# Patient Record
Sex: Male | Born: 1985 | Race: White | Hispanic: No | Marital: Single | Smoking: Current every day smoker
Health system: Southern US, Community
[De-identification: ages and names within clinical notes are randomized; demographics above are authoritative.]

## PROBLEM LIST (undated history)

## (undated) ENCOUNTER — Emergency Department (HOSPITAL_COMMUNITY): Discharge status: Against Medical Advice | Payer: Self-pay

## (undated) DIAGNOSIS — F909 Attention-deficit hyperactivity disorder, unspecified type: Secondary | ICD-10-CM

## (undated) DIAGNOSIS — R002 Palpitations: Secondary | ICD-10-CM

---
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
## 2004-08-01 ENCOUNTER — Emergency Department (HOSPITAL_COMMUNITY): Admission: EM | Admit: 2004-08-01 | Discharge: 2004-08-02 | Payer: Self-pay | Admitting: Emergency Medicine
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
## 2010-09-24 ENCOUNTER — Emergency Department (HOSPITAL_COMMUNITY)
Admission: EM | Admit: 2010-09-24 | Discharge: 2010-09-25 | Disposition: A | Discharge status: Home / Self Care | Payer: Self-pay | Attending: Emergency Medicine | Admitting: Emergency Medicine

## 2010-09-24 DIAGNOSIS — W540XXA Bitten by dog, initial encounter: Secondary | ICD-10-CM | POA: Insufficient documentation

## 2010-09-24 DIAGNOSIS — T148XXA Other injury of unspecified body region, initial encounter: Secondary | ICD-10-CM | POA: Insufficient documentation

## 2010-09-24 DIAGNOSIS — Z87891 Personal history of nicotine dependence: Secondary | ICD-10-CM | POA: Insufficient documentation

## 2012-02-16 ENCOUNTER — Emergency Department (HOSPITAL_COMMUNITY)
Admission: EM | Admit: 2012-02-16 | Discharge: 2012-02-17 | Disposition: A | Discharge status: Home / Self Care | Payer: Self-pay | Attending: Emergency Medicine | Admitting: Emergency Medicine

## 2012-02-16 ENCOUNTER — Encounter (HOSPITAL_COMMUNITY): Payer: Self-pay | Admitting: Emergency Medicine

## 2012-02-16 DIAGNOSIS — Z8659 Personal history of other mental and behavioral disorders: Secondary | ICD-10-CM | POA: Insufficient documentation

## 2012-02-16 DIAGNOSIS — F1010 Alcohol abuse, uncomplicated: Secondary | ICD-10-CM | POA: Insufficient documentation

## 2012-02-16 DIAGNOSIS — F17200 Nicotine dependence, unspecified, uncomplicated: Secondary | ICD-10-CM | POA: Insufficient documentation

## 2012-02-16 DIAGNOSIS — Z8679 Personal history of other diseases of the circulatory system: Secondary | ICD-10-CM | POA: Insufficient documentation

## 2012-02-16 DIAGNOSIS — F1210 Cannabis abuse, uncomplicated: Secondary | ICD-10-CM | POA: Insufficient documentation

## 2012-02-16 DIAGNOSIS — F4320 Adjustment disorder, unspecified: Secondary | ICD-10-CM | POA: Insufficient documentation

## 2012-02-16 HISTORY — DX: Palpitations: R00.2

## 2012-02-16 HISTORY — DX: Attention-deficit hyperactivity disorder, unspecified type: F90.9

## 2012-02-16 LAB — COMPREHENSIVE METABOLIC PANEL
Alkaline Phosphatase: 50 U/L (ref 39–117)
BUN: 20 mg/dL (ref 6–23)
GFR calc Af Amer: >90 mL/min (ref >90)
Glucose, Bld: 100 mg/dL — ABNORMAL HIGH (ref 70–99)
Potassium: 3.8 mEq/L (ref 3.5–5.1)
Total Bilirubin: 0.4 mg/dL (ref 0.3–1.2)
Total Protein: 8.1 g/dL (ref 6.0–8.3)

## 2012-02-16 LAB — ACETAMINOPHEN LEVEL: Acetaminophen (Tylenol), Serum: <15 ug/mL (ref 10–30)

## 2012-02-16 LAB — ETHANOL: Alcohol, Ethyl (B): <11 mg/dL (ref 0–11)

## 2012-02-16 LAB — SALICYLATE LEVEL: Salicylate Lvl: 3.4 mg/dL (ref 2.8–20.0)

## 2012-02-16 LAB — CBC
HCT: 44.6 % (ref 39.0–52.0)
Hemoglobin: 15.9 g/dL (ref 13.0–17.0)
MCH: 30.5 pg (ref 26.0–34.0)
MCHC: 35.7 g/dL (ref 30.0–36.0)
MCV: 85.6 fL (ref 78.0–100.0)
Platelets: 237 10*3/uL (ref 150–400)
RBC: 5.21 MIL/uL (ref 4.22–5.81)
RDW: 11.9 % (ref 11.5–15.5)
WBC: 13.2 10*3/uL — ABNORMAL HIGH (ref 4.0–10.5)

## 2012-02-16 LAB — RAPID URINE DRUG SCREEN, HOSP PERFORMED
Barbiturates: NOT DETECTED
Cocaine: NOT DETECTED

## 2012-02-16 MED ORDER — IBUPROFEN 200 MG PO TABS: 600.0000 mg | ORAL_TABLET | Freq: Three times a day (TID) | ORAL | Status: DC | PRN

## 2012-02-16 MED ORDER — NICOTINE 21 MG/24HR TD PT24: 21.0000 mg | MEDICATED_PATCH | Freq: Every day | TRANSDERMAL | Status: DC

## 2012-02-16 MED ORDER — LORAZEPAM 1 MG PO TABS: 1.0000 mg | ORAL_TABLET | Freq: Three times a day (TID) | ORAL | Status: DC | PRN

## 2012-02-16 MED ORDER — ZOLPIDEM TARTRATE 5 MG PO TABS: 5.0000 mg | ORAL_TABLET | Freq: Every evening | ORAL | Status: DC | PRN

## 2012-02-16 MED ORDER — ONDANSETRON HCL 4 MG PO TABS: 4.0000 mg | ORAL_TABLET | Freq: Three times a day (TID) | ORAL | Status: DC | PRN

## 2012-02-16 MED ORDER — ALUM & MAG HYDROXIDE-SIMETH 200-200-20 MG/5ML PO SUSP: 30.0000 mL | ORAL | Status: DC | PRN

## 2012-02-16 NOTE — ED Notes (Addendum)
Pt denies SI/HI. Mother has called the police to bring him here b/c of him being "emotionally out of control" and his anger issues.  Pt is voluntary and states he wants to talk to a doctor to help him get his mind straight.  Deputy [place] ([place]) is willing to come back to pick him and take him home if necessary.
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
## 2012-02-16 NOTE — ED Provider Notes (Signed)
Medical screening examination/treatment/procedure(s) were performed by non-physician practitioner and as supervising physician I was immediately available for consultation/collaboration.  Celene Kras, MD 02/16/12 2322

## 2012-02-16 NOTE — [facility] Counselor (Signed)
Telepsych paperwork completed and called in to [facility].
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
## 2012-02-16 NOTE — ED Notes (Signed)
Called up front to see if they have IVC papers on pt.

## 2012-02-18 NOTE — ED Provider Notes (Signed)
Medical screening examination/treatment/procedure(s) were performed by non-physician practitioner and as supervising physician I was immediately available for consultation/collaboration.   Nikos Anglemyer R Renny Remer, MD 02/18/12 2148 

## 2014-01-05 ENCOUNTER — Emergency Department (HOSPITAL_COMMUNITY)
Admission: EM | Admit: 2014-01-05 | Discharge: 2014-01-06 | Disposition: A | Discharge status: Home / Self Care | Payer: Self-pay | Attending: Emergency Medicine | Admitting: Emergency Medicine

## 2014-01-05 ENCOUNTER — Encounter (HOSPITAL_COMMUNITY): Payer: Self-pay | Admitting: Emergency Medicine

## 2014-01-05 DIAGNOSIS — F919 Conduct disorder, unspecified: Secondary | ICD-10-CM | POA: Insufficient documentation

## 2014-01-05 DIAGNOSIS — F1210 Cannabis abuse, uncomplicated: Secondary | ICD-10-CM | POA: Insufficient documentation

## 2014-01-05 DIAGNOSIS — R4689 Other symptoms and signs involving appearance and behavior: Secondary | ICD-10-CM

## 2014-01-05 DIAGNOSIS — Z720 Tobacco use: Secondary | ICD-10-CM | POA: Insufficient documentation

## 2014-01-05 LAB — COMPREHENSIVE METABOLIC PANEL
ALBUMIN: 4.4 g/dL (ref 3.5–5.2)
ALT: 25 U/L (ref 0–53)
ANION GAP: 5 (ref 5–15)
AST: 28 U/L (ref 0–37)
Alkaline Phosphatase: 51 U/L (ref 39–117)
BILIRUBIN TOTAL: 0.7 mg/dL (ref 0.3–1.2)
BUN: 15 mg/dL (ref 6–23)
CALCIUM: 9 mg/dL (ref 8.4–10.5)
CHLORIDE: 108 meq/L (ref 96–112)
CO2: 26 mmol/L (ref 19–32)
CREATININE: 0.87 mg/dL (ref 0.50–1.35)
GFR calc Af Amer: >90 mL/min (ref >90)
GFR calc non Af Amer: >90 mL/min (ref >90)
Glucose, Bld: 121 mg/dL — ABNORMAL HIGH (ref 70–99)
Potassium: 3.6 mmol/L (ref 3.5–5.1)
Sodium: 139 mmol/L (ref 135–145)
TOTAL PROTEIN: 7.6 g/dL (ref 6.0–8.3)

## 2014-01-05 LAB — RAPID URINE DRUG SCREEN, HOSP PERFORMED
Amphetamines: NOT DETECTED
BARBITURATES: NOT DETECTED
BENZODIAZEPINES: NOT DETECTED
COCAINE: NOT DETECTED
Opiates: NOT DETECTED
Tetrahydrocannabinol: POSITIVE — AB

## 2014-01-05 LAB — CBC
HEMATOCRIT: 43.2 % (ref 39.0–52.0)
Hemoglobin: 14.6 g/dL (ref 13.0–17.0)
MCH: 30.5 pg (ref 26.0–34.0)
MCHC: 33.8 g/dL (ref 30.0–36.0)
MCV: 90.2 fL (ref 78.0–100.0)
PLATELETS: 243 10*3/uL (ref 150–400)
RBC: 4.79 MIL/uL (ref 4.22–5.81)
RDW: 12.1 % (ref 11.5–15.5)
WBC: 12 10*3/uL — AB (ref 4.0–10.5)

## 2014-01-05 LAB — SALICYLATE LEVEL

## 2014-01-05 LAB — ETHANOL

## 2014-01-05 LAB — ACETAMINOPHEN LEVEL: Acetaminophen (Tylenol), Serum: <10 ug/mL — ABNORMAL LOW (ref 10–30)
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
## 2014-01-05 NOTE — ED Notes (Signed)
Pt arrived to the ED with a complaint of aggressive behavior.  PT is IVC'd by his father.  IVC paperwork states that Pt has been drinking and doing drugs, becoming aggressive with parents and neighbors.  Pt states complaints against him are overrated.  Pt states that his does use xanax occasionally since the death of his brother in April.  Pt is calm and cooperative in triage.  Sheriff is present in the room

## 2014-01-05 NOTE — ED Notes (Signed)
TTS at bedside. 
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
## 2014-01-06 NOTE — ED Notes (Signed)
Sheriff called for pt transport.

## 2014-01-06 NOTE — Discharge Instructions (Signed)
Aggression Physically aggressive behavior is common among small children. When frustrated or angry, toddlers may act out. Often, they will push, bite, or hit. Most children show less physical aggression as they grow up. Their language and interpersonal skills improve, too. But continued aggressive behavior is a sign of a problem. This behavior can lead to aggression and delinquency in adolescence and adulthood. Aggressive behavior can be psychological or physical. Forms of psychological aggression include threatening or bullying others. Forms of physical aggression include:  Pushing.  Hitting.  Slapping.  Kicking.  Stabbing.  Shooting.  Raping. PREVENTION  Encouraging the following behaviors can help manage aggression:  Respecting others and valuing differences.  Participating in school and community functions, including sports, music, after-school programs, community groups, and volunteer work.  Talking with an adult when they are sad, depressed, fearful, anxious, or angry. Discussions with a parent or other family member, Veterinary surgeoncounselor, Runner, broadcasting/film/videoteacher, or coach can help.  Avoiding alcohol and drug use.  Dealing with disagreements without aggression, such as conflict resolution. To learn this, children need parents and caregivers to model respectful communication and problem solving.  Limiting exposure to aggression and violence, such as video games that are not age appropriate, violence in the media, or domestic violence. Document Released: 10/23/2006 Document Revised: 03/20/2011 Document Reviewed: 03/03/2010 [facility] Patient Information 2015 [place], [place]. This information is not intended to replace advice given to you by your health care provider. Make sure you discuss any questions you have with your health care provider.   Emergency Department Resource Guide 1) Find a Doctor and Pay Out of Pocket Although you won't have to find out who is covered by your insurance plan, it is a  good idea to ask around and get recommendations. You will then need to call the office and see if the doctor you have chosen will accept you as a new patient and what types of options they offer for patients who are self-pay. Some doctors offer discounts or will set up payment plans for their patients who do not have insurance, but you will need to ask so you aren't surprised when you get to your appointment.  2) Contact Your Local Health Department Not all health departments have doctors that can see patients for sick visits, but many do, so it is worth a call to see if yours does. If you don't know where your local health department is, you can check in your phone book. The CDC also has a tool to help you locate your state's health department, and many state websites also have listings of all of their local health departments.  3) Find a Walk-in Clinic If your illness is not likely to be very severe or complicated, you may want to try a walk in clinic. These are popping up all over the country in pharmacies, drugstores, and shopping centers. They're usually staffed by nurse practitioners or physician assistants that have been trained to treat common illnesses and complaints. They're usually fairly quick and inexpensive. However, if you have serious medical issues or chronic medical problems, these are probably not your best option.  No Primary Care Doctor: - Call Health Connect at  [phone] - they can help you locate a primary care doctor that  accepts your insurance, provides certain services, etc. - Physician Referral Service- [phone]  Chronic Pain Problems: Organization         Address  Phone   Notes  [facility]  [phone]) [phone] Patients need to be referred by  their primary care doctor.   Medication Assistance: Organization         Address  Phone   Notes  [facility] Medication [facility] [address] [place], [place] [address] [phone])  [phone] --Must be a resident of [facility] -- Must have NO insurance coverage whatsoever (no Medicaid/ Medicare, etc.) -- The pt. MUST have a primary care doctor that directs their care regularly and follows them in the community   MedAssist  [phone]   [employer]  [phone]) [phone]    Agencies that provide inexpensive medical care: Organization         Address  Phone   Notes  [facility]  [phone](336) [phone]   [facility]    [phone](336) [phone]   [facility] [address] [place], [place] [address] [phone]) [phone]   [facility] [address], [place] [phone]) [phone]   Planned Parenthood    [phone]) [phone]   [facility]    [phone]) [phone]   [facility] and [facility]  [address], [place] Phone:  [phone]) [phone], Fax:  [phone] Hours of Operation:  9 am - 6 pm, M-F.  Also accepts Medicaid/Medicare and self-pay.  [facility]  [address], [place] Phone: [phone](336) [phone], Fax: [phone]. Hours of Operation:  8:30 am - 5:30 pm, M-F.  Also accepts Medicaid and self-pay.  [facility] [place] [address], [place] Phone: [phone]) [phone]   [facility] [address], [place], [place] [phone], Ext. [phone] Mondays & Thursdays: 7-9 AM.  First 15 patients are seen on a first come, first serve basis.    Medicaid-accepting [facility] Providers:  Organization         Address  Phone   Notes  [facility] [address], [place] [phone] Also accepts self-pay patients.  [facility] [address], [place]  [phone]) [phone]   [facility] [address], [place] [phone]) [phone]   [facility] [address] [place] [address], [place] [phone]) [phone]   Renaye RakersVeita Bland [address], [place]   [phone](336)  [phone] Only accepts [place] Access [place]icaid patients after they have their name applied to their card.   Self-Pay (no insurance) in [place]:  Organization         Address  Phone   Notes  Sickle Cell Patients, [facility] [address], [place] [phone]) [phone]   [facility] [address], [place] [phone]) [phone]   [facility] [place]  [address],  [phone](336) [phone]   [facility]/Dr. Osei-Bonsu  [address] [place] [address], [place] or [id number], [place] [phone]) [phone] Phone number for both [place] and [place] locations is the same.  [facility] and [facility] [address], [place] [phone]) [phone]   [facility]  [address] [place] [address], [place] or [address] [phone](336) [phone]   [facility] [address], [place] [phone](336) [phone], phone; [phone](336) [phone], fax Sees patients 1st and 3rd Saturday of every month.  Must not qualify for public or private insurance (i.e. Medicaid, Medicare, [place] Health Choice, Veterans' Benefits)  Household income should be no more than  200% of the poverty level The clinic cannot treat you if you are pregnant or think you are pregnant  Sexually transmitted diseases are not treated at the clinic.    Dental Care: Organization         Address  Phone  Notes  [facility] of [facility] [address], [place] [phone]) [phone] Accepts children up to age 29 who are enrolled in [place]icaid or [place] Health Choice; pregnant women with a Medicaid card; and children who have applied for Medicaid or [place] Health Choice, but were declined, whose parents can pay a reduced fee at time of service.  [facility] of [facility] [place]  [address], [place] [phone]) [phone] Accepts children up to age 721 who are enrolled in [place]icaid or [place] Health  Choice; pregnant women with a Medicaid card; and children who have applied for Medicaid or  Health Choice, but were declined, whose parents can pay a reduced fee at time of service.  [facility]  [address], [place] [phone]) [phone] Patients are seen by appointment only. Walk-ins are not accepted. [facility] will see patients 29 years of age and older. Monday - Tuesday (8am-5pm) Most Wednesdays (8:30-5pm) $30 per visit, cash only  [facility]  [address], [facility] [phone]) [phone] Patients are seen by appointment only. Walk-ins are not accepted. [facility] will see patients 29 years of age and older. One Wednesday Evening (Monthly: Volunteer Based).  $30 per visit, cash only  [employer] [facility] [facility]  [phone]) [phone] for adults; Children under age 774, call Graduate Pediatric Dentistry at [phone]) [phone]. Children aged 464-14, please call [phone]) [phone] to request a pediatric application.  Dental services are provided in all areas of dental care including fillings, crowns and bridges, complete and partial dentures, implants, gum treatment, root canals, and extractions. Preventive care is also provided. Treatment is provided to both adults and children. Patients are selected via a lottery and there is often a waiting list.   [facility] [address], [place]  [phone]) [phone] [email or web address]   [facility] [address], [place], [place] [phone], Ext. [phone] Second and Fourth Thursday of each month, opens at 6:30 AM; Clinic ends at 9 AM.  Patients are seen on a first-come first-served basis, and a limited number are seen during each clinic.   [facility]  [address], [place], [place] [phone]) [phone]   Eligibility Requirements You must have lived in [place], [place], or [place] for at least the last three months.   You cannot be eligible for state or  federal sponsored National Cityhealthcare insurance, including CIGNAVeterans Administration, [place]icaid, or Harrah's EntertainmentMedicare.   You generally cannot be eligible for healthcare insurance through your employer.    How to apply: Eligibility screenings are held every Tuesday and Wednesday afternoon from 1:00 pm until 4:00 pm. You do not need an appointment for the interview!  [facility] [address], [place], [place] [phone]   [facility] Health Department  [phone]   [facility] Health Department  [phone]   [facility]  [phone]    Behavioral Health Resources in the Community: Intensive Outpatient Programs Organization         Address  Phone  Notes  [facility] [address], [place], [place] [phone]   [facility]  Outpatient [address], [place], [place] [phone]   [facility]: [facility] Drug S[facility] [address], [place], [place]  [phone]   [facility] [address],  [place], [place] [phone] or [phone]   Substance Abuse Resources Organization         Address  Phone  Notes  [facility] Drug [facility]  [phone]   [facility]  [phone]   [facility]  [phone]   [facility]  [phone]   Residential & Outpatient Substance Abuse Program  [phone]   Psychological Services Organization         Address  Phone  Notes  [facility]  [phone]- [phone]   [facility]  [phone]- [phone]   [facility] [address], [place] [phone] or [phone]    Mobile Crisis Teams Organization         Address  Phone  Notes  [facility], Mobile Crisis Care Unit  [phone]   [facility]  [address] [place], [place] [phone]   Doristine LocksSharon DeEsch [address] [place] [phone]    Self-Help/Support Groups Organization         Address  Phone              Notes  [facility]ental [facility] Assoc. of [place] - variety of support groups  336- [phone] Call for more information  Narcotics Anonymous (NA), [facility]aring [facility] [address], [place]  2 meetings at this location   Statisticianesidential Treatment Programs Organization         Address  Phone  Notes  [facility] [address],    [place]  [phone]   [facility]  [address], [place] 175102[address]118, [place], [place] [phone]   [facility] [address], [place] [phone] Admissions: 8am-3pm M-F  [facility] [address]    [place], [place] [phone]   [facility] [address], [place], [place] [phone]   [facility] [address]  [place], [place] [phone]   [facility] - Intensive Outpatient [address], [place], [place] [phone]   [facility] (Addiction Recovery Care Assoc[facility] [address]  [place], [place] [phone] or [phone]   [facility] (RTS) [address] [place], [place] [phone] Accepts Medicaid  [facility] [address]  [place] [phone] Substance Abuse/Addiction Treatment   [facility] Behavioral Health Resources Organization         Address  Phone  Notes  [facility]  [phone]) [phone]   Angie FavaJulie Brannon, PhD [address], [address] [place], [place]   [phone](336) [phone] or [phone](336) [phone]   [facility]   [address] [place], [place] [phone](336) [phone]   [facility] [address], [place], [place] [phone](336) [phone] Insurance/Medicaid/sponsorship through [facility]  Faith and Families [address]                                    [place], [place] [phone](336) [phone] Therapy/tele-psych/case  [facility] [address].   [place], [place] [phone]    Dr. Lolly MustacheArfeen  [phone](336) [phone]   [facility] [place]  [employer] [facility]  Dept. 1) [address], [place] 2) [address], [place] 3)  [address] [place]  [address], [place] [phone] °([phone] ° °([phone]) [phone]   °[place] Child Abuse Hotline (336) 342-1394 or (336) [phone] (After Hours)    ° ° ° °

## 2014-01-06 NOTE — ED Notes (Signed)
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

## 2014-01-06 NOTE — ED Notes (Signed)
Pt denies SI/HI/AVH.  Preparing for discharge.
